# Patient Record
Sex: Male | Born: 2000 | Hispanic: No | Marital: Single | Smoking: Never smoker
Health system: Southern US, Community
[De-identification: ages and names within clinical notes are randomized; demographics above are authoritative.]

## PROBLEM LIST (undated history)

## (undated) DIAGNOSIS — I209 Angina pectoris, unspecified: Secondary | ICD-10-CM

## (undated) DIAGNOSIS — G43909 Migraine, unspecified, not intractable, without status migrainosus: Secondary | ICD-10-CM

## (undated) DIAGNOSIS — IMO0001 Reserved for inherently not codable concepts without codable children: Secondary | ICD-10-CM

## (undated) DIAGNOSIS — K219 Gastro-esophageal reflux disease without esophagitis: Secondary | ICD-10-CM

## (undated) DIAGNOSIS — Z464 Encounter for fitting and adjustment of orthodontic device: Secondary | ICD-10-CM

---
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
## 2021-01-09 ENCOUNTER — Other Ambulatory Visit: Payer: Self-pay | Admitting: Pain Medicine

## 2021-01-09 DIAGNOSIS — M545 Low back pain, unspecified: Secondary | ICD-10-CM

## 2021-01-09 DIAGNOSIS — R200 Anesthesia of skin: Secondary | ICD-10-CM

## 2021-01-09 DIAGNOSIS — M79606 Pain in leg, unspecified: Secondary | ICD-10-CM

## 2021-02-04 DIAGNOSIS — Z6830 Body mass index (BMI) 30.0-30.9, adult: Secondary | ICD-10-CM | POA: Insufficient documentation

## 2021-02-04 DIAGNOSIS — M5416 Radiculopathy, lumbar region: Secondary | ICD-10-CM | POA: Insufficient documentation

## 2021-03-20 DIAGNOSIS — R55 Syncope and collapse: Secondary | ICD-10-CM | POA: Insufficient documentation

## 2021-03-20 DIAGNOSIS — R079 Chest pain, unspecified: Secondary | ICD-10-CM | POA: Insufficient documentation

## 2021-03-20 DIAGNOSIS — U099 Post covid-19 condition, unspecified: Secondary | ICD-10-CM | POA: Insufficient documentation

## 2021-04-14 DIAGNOSIS — R000 Tachycardia, unspecified: Secondary | ICD-10-CM | POA: Insufficient documentation

## 2021-04-14 DIAGNOSIS — Z8679 Personal history of other diseases of the circulatory system: Secondary | ICD-10-CM | POA: Insufficient documentation

## 2021-04-14 HISTORY — DX: Personal history of other diseases of the circulatory system: Z86.79

## 2021-05-01 ENCOUNTER — Other Ambulatory Visit: Payer: Self-pay | Admitting: Neurosurgery

## 2021-05-06 ENCOUNTER — Other Ambulatory Visit: Payer: Self-pay | Admitting: Neurosurgery

## 2021-05-16 NOTE — Progress Notes (Signed)
Surgical Instructions    Your procedure is scheduled on Thursday, November 10th, 2022.   Report to [facility] Main Entrance "A" at 05:30 A.M., then check in with the Admitting office.  Call this number if you have problems the morning of surgery:  [phone]   If you have any questions prior to your surgery date call [phone]: Open Monday-Friday 8am-4pm    Remember:  Do not eat or drink after midnight the night before your surgery     Take these medicines the morning of surgery with A SIP OF WATER:  pantoprazole (PROTONIX) - if needed    As of today, STOP taking any Aspirin (unless otherwise instructed by your surgeon) Aleve, Naproxen, Ibuprofen, Motrin, Advil, Goody's, BC's, all herbal medications, fish oil, and all vitamins.    After your COVID test   You are not required to quarantine however you are required to wear a well-fitting mask when you are out and around people not in your household.  If your mask becomes wet or soiled, replace with a new one.  Wash your hands often with soap and water for 20 seconds or clean your hands with an alcohol-based hand sanitizer that contains at least 60% alcohol.  Do not share personal items.  Notify your provider: if you are in close contact with someone who has COVID  or if you develop a fever of 100.4 or greater, sneezing, cough, sore throat, shortness of breath or body aches.    The day of surgery:          Do not wear jewelry  Do not wear lotions, powders, colognes, or deodorant. Men may shave face and neck. Do not bring valuables to the hospital.              [facility] is not responsible for any belongings or valuables.  Do NOT Smoke (Tobacco/Vaping)  24 hours prior to your procedure  If you use a CPAP at night, you may bring your mask for your overnight stay.   Contacts, glasses, hearing aids, dentures or partials may not be worn into surgery, please bring cases for these belongings   For patients admitted  to the hospital, discharge time will be determined by your treatment team.   Patients discharged the day of surgery will not be allowed to drive home, and someone needs to stay with them for 24 hours.  NO VISITORS WILL BE ALLOWED IN PRE-OP WHERE PATIENTS ARE PREPPED FOR SURGERY.  ONLY 1 SUPPORT PERSON MAY BE PRESENT IN THE WAITING ROOM WHILE YOU ARE IN SURGERY.  IF YOU ARE TO BE ADMITTED, ONCE YOU ARE IN YOUR ROOM YOU WILL BE ALLOWED TWO (2) VISITORS. 1 (ONE) VISITOR MAY STAY OVERNIGHT BUT MUST ARRIVE TO THE ROOM BY 8pm.  Minor children may have two parents present. Special consideration for safety and communication needs will be reviewed on a case by case basis.  Special instructions:    Oral Hygiene is also important to reduce your risk of infection.  Remember - BRUSH YOUR TEETH THE MORNING OF SURGERY WITH YOUR REGULAR TOOTHPASTE   [facility]- Preparing For Surgery  Before surgery, you can play an important role. Because skin is not sterile, your skin needs to be as free of germs as possible. You can reduce the number of germs on your skin by washing with CHG (chlorahexidine gluconate) Soap before surgery.  CHG is an antiseptic cleaner which kills germs and bonds with the skin to continue killing germs even after  washing.     Please do not use if you have an allergy to CHG or antibacterial soaps. If your skin becomes reddened/irritated stop using the CHG.  Do not shave (including legs and underarms) for at least 48 hours prior to first CHG shower. It is OK to shave your face.  Please follow these instructions carefully.     Shower the NIGHT BEFORE SURGERY and the MORNING OF SURGERY with CHG Soap.   If you chose to wash your hair, wash your hair first as usual with your normal shampoo. After you shampoo, rinse your hair and body thoroughly to remove the shampoo.  Then [employer] and genitals (private parts) with your normal soap and rinse thoroughly to remove soap.  After that Use CHG Soap  as you would any other liquid soap. You can apply CHG directly to the skin and wash gently with a scrungie or a clean washcloth.   Apply the CHG Soap to your body ONLY FROM THE NECK DOWN.  Do not use on open wounds or open sores. Avoid contact with your eyes, ears, mouth and genitals (private parts). Wash Face and genitals (private parts)  with your normal soap.   Wash thoroughly, paying special attention to the area where your surgery will be performed.  Thoroughly rinse your body with warm water from the neck down.  DO NOT shower/wash with your normal soap after using and rinsing off the CHG Soap.  Pat yourself dry with a CLEAN TOWEL.  Wear CLEAN PAJAMAS to bed the night before surgery  Place CLEAN SHEETS on your bed the night before your surgery  DO NOT SLEEP WITH PETS.   Day of Surgery:  Take a shower with CHG soap. Wear Clean/Comfortable clothing the morning of surgery Do not apply any deodorants/lotions.   Remember to brush your teeth WITH YOUR REGULAR TOOTHPASTE.   Please read over the following fact sheets that you were given.

## 2021-05-19 ENCOUNTER — Encounter (HOSPITAL_COMMUNITY): Payer: Self-pay

## 2021-05-19 ENCOUNTER — Encounter (HOSPITAL_COMMUNITY)
Admission: RE | Admit: 2021-05-19 | Discharge: 2021-05-19 | Disposition: A | Discharge status: Home / Self Care | Source: Ambulatory Visit | Attending: Neurosurgery | Admitting: Neurosurgery

## 2021-05-19 ENCOUNTER — Other Ambulatory Visit: Payer: Self-pay

## 2021-05-19 VITALS — BP 131/77 | HR 69 | Temp 98.1°F | Resp 18 | Ht 73.0 in | Wt 240.6 lb

## 2021-05-19 DIAGNOSIS — Z20822 Contact with and (suspected) exposure to covid-19: Secondary | ICD-10-CM | POA: Diagnosis not present

## 2021-05-19 DIAGNOSIS — Z01812 Encounter for preprocedural laboratory examination: Secondary | ICD-10-CM | POA: Insufficient documentation

## 2021-05-19 DIAGNOSIS — Z01818 Encounter for other preprocedural examination: Secondary | ICD-10-CM

## 2021-05-19 HISTORY — DX: Gastro-esophageal reflux disease without esophagitis: K21.9

## 2021-05-19 HISTORY — DX: Angina pectoris, unspecified: I20.9

## 2021-05-19 LAB — BASIC METABOLIC PANEL
Anion gap: 7 (ref 5–15)
BUN: 12 mg/dL (ref 6–20)
CO2: 27 mmol/L (ref 22–32)
Calcium: 9.5 mg/dL (ref 8.9–10.3)
Chloride: 104 mmol/L (ref 98–111)
Creatinine, Ser: 1.08 mg/dL (ref 0.61–1.24)
GFR, Estimated: >60 mL/min (ref >60)
Glucose, Bld: 80 mg/dL (ref 70–99)
Potassium: 4.1 mmol/L (ref 3.5–5.1)
Sodium: 138 mmol/L (ref 135–145)

## 2021-05-19 LAB — CBC
HCT: 45.5 % (ref 39.0–52.0)
Hemoglobin: 14.9 g/dL (ref 13.0–17.0)
MCH: 29 pg (ref 26.0–34.0)
MCHC: 32.7 g/dL (ref 30.0–36.0)
MCV: 88.7 fL (ref 80.0–100.0)
Platelets: 235 10*3/uL (ref 150–400)
RBC: 5.13 MIL/uL (ref 4.22–5.81)
RDW: 12.7 % (ref 11.5–15.5)
WBC: 4.4 10*3/uL (ref 4.0–10.5)
nRBC: 0 % (ref 0.0–0.2)

## 2021-05-19 NOTE — Progress Notes (Addendum)
PCP - Emogene Morgan, Nile Dear, DNP, FNP Cardiologist - Ofilia Neas, MD  PPM/ICD - denies Device Orders - n/a Rep Notified - n/a  Chest x-ray - 02/18/2021 - CE EKG - 03/19/2021 - CE Stress Test - 04/11/2021 ECHO - 05/03/2020 - requested tracing Cardiac Cath - denies  Sleep Study - denies CPAP - n/a  Fasting Blood Sugar - n/a  Blood Thinner Instructions: n/a  Aspirin Instructions: Patient was instructed: As of today, STOP taking any Aspirin (unless otherwise instructed by your surgeon) Aleve, Naproxen, Ibuprofen, Motrin, Advil, Goody's, BC's, all herbal medications, fish oil, and all vitamins.    ERAS Protcol - n/a   COVID TEST- done in PAT on 05/19/2021   Anesthesia review: yes; history of chest pain; requested EKG tracing  Patient denies shortness of breath, fever, cough and chest pain at PAT appointment   All instructions explained to the patient, with a verbal understanding of the material. Patient agrees to go over the instructions while at home for a better understanding. Patient also instructed to self quarantine after being tested for COVID-19. The opportunity to ask questions was provided.

## 2021-05-20 LAB — SARS CORONAVIRUS 2 (TAT 6-24 HRS): SARS Coronavirus 2: NEGATIVE

## 2021-05-20 NOTE — Progress Notes (Signed)
Anesthesia Chart Review:  Patient had recent benign cardiac work-up at [facility] for what was ultimately felt to be symptoms of probable long COVID.  Last seen 04/14/2021 by Dr. Cecile Hearing.  Per note: Mr. Hippler is a 20 y.o. male who was previously healthy. I have been evaluating him for cardiovascular complications of probable long COVID which has fortunately been reassuring. He is seen today for follow-up  Tachycardia: Initially with some concern for supraventricular tachycardia on outside preliminary Holter reading. After personally reviewing the strips and learning that he was exercising without new symptoms at the time of these events I suspect this is all sinus tachycardia rather than SVT. This was further confirmed with exercise treadmill in our office last week. 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 I suspect his viral illness in 02/2020 was COVID and this may have been the trigger for long-covid type illness.  -- Recent labs reassuring and there is no signs of ongoing inflammation -- He will continue to follow with PCP -- Echo is reassuring ZIO only significant for sinus tachycardia --Advised ongoing exercise and targeting mid range cardiovascular intensity in addition to high intensity  Possible Prior COVID Myopericarditis: The timing and his initial presentation in 2021 were suggestive of COVID myopericarditis (or pericarditis alone) either from COVID infection in August or as a complication of the COVID vaccine. If it was present, the case was overall mild as the markers of inflammation had already normalized by the time he was evaluated in [place]. Suspect no residual damage. -- Echocardiogram reason inflammatory markers all normal -- DC MRI order as this is unlikely to change  management moving forward  Abnormal ECG: His ECG has a marked repolarization abnormality which is stable from his recent ER visit. Suspect normal variant and reassurance provided.  -- ETT reassuring -- He has had cardiac CTA as part of work-up earlier this year. He will send me a copy of these results  Return if symptoms worsen or fail to improve.  Preop labs reviewed, WNL.  EKG 03/19/2021 (Care Everywhere): Normal sinus rhythm.  Rate 81.  Nonspecific ST and T wave abnormality, consider ischemia, ventricular enlargement.  When compared with EKG of 02/18/2021, T wave inversions less pronounced anterior leads.  Exercise tolerance test 04/10/21: -  Adequate exercise capacity, with no exercise induced chest pain or  dyspnea at an estimated workload of 15.50 METS.  Prolongation of HR  recovery suggests mild cardiovascular deconditioning.  -  Normal ECG, rhythm and BP response to exercise at 92% of the  age-predicted maximal HR.  -  There are no rhythm changes to suggest a diagnosis of supraventricular  tachycardia  -  Normalization of ST segments with increase in HR supports baseline ECG  changes being due to early repolarization   TTE 04/10/21: Summary    1. The left ventricle is normal in size with normal wall thickness.    2. The left ventricular systolic function is normal, LVEF is visually  estimated at > 55%.    3. The right ventricle is normal in size, with normal systolic function.    4. There is no pulmonary hypertension.     [facility] Short Stay Center/Anesthesiology Phone [phone] 05/20/2021 3:24 PM

## 2021-05-20 NOTE — Anesthesia Preprocedure Evaluation (Addendum)
Anesthesia Evaluation  Patient identified by MRN, date of birth, ID band Patient awake    Reviewed: Allergy & Precautions, NPO status , Patient's Chart, lab work & pertinent test results  History of Anesthesia Complications Negative for: history of anesthetic complications  Airway Mallampati: II  TM Distance: >3 FB Neck ROM: Full    Dental  (+) Teeth Intact, Dental Advisory Given   Pulmonary neg pulmonary ROS,    Pulmonary exam normal        Cardiovascular negative cardio ROS Normal cardiovascular exam  Exercise tolerance test 04/10/21: - Adequate exercise capacity, with no exercise induced chest pain or  dyspnea at an estimated workload of 15.50 METS. Prolongation of HR  recovery suggests mild cardiovascular deconditioning.  - Normal ECG, rhythm and BP response to exercise at 92% of the  age-predicted maximal HR.  - There are no rhythm changes to suggest a diagnosis of supraventricular  tachycardia  - Normalization of ST segments with increase in HR supports baseline ECG  changes being due to early repolarization   TTE 04/10/21: Summary   1. The left ventricle is normal in size with normal wall thickness.   2. The left ventricular systolic function is normal, LVEF is visually  estimated at > 55%.   3. The right ventricle is normal in size, with normal systolic function.   4. There is no pulmonary hypertension.     Neuro/Psych negative neurological ROS     GI/Hepatic Neg liver ROS, GERD  Medicated and Controlled,  Endo/Other  negative endocrine ROS  Renal/GU negative Renal ROS     Musculoskeletal negative musculoskeletal ROS (+)   Abdominal   Peds  Hematology negative hematology ROS (+)   Anesthesia Other Findings   Reproductive/Obstetrics                          Anesthesia Physical Anesthesia Plan  ASA: 3  Anesthesia Plan: General   Post-op Pain Management:     Induction: Intravenous  PONV Risk Score and Plan: 3 and Ondansetron, Dexamethasone and Midazolam  Airway Management Planned: Oral ETT  Additional Equipment:   Intra-op Plan:   Post-operative Plan: Extubation in OR  Informed Consent: I have reviewed the patients History and Physical, chart, labs and discussed the procedure including the risks, benefits and alternatives for the proposed anesthesia with the patient or authorized representative who has indicated his/her understanding and acceptance.     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  Last seen 04/14/2021 by Dr. Cecile Hearing.  Per note: "Mr. Cappella is a 20 y.o. male who was previously healthy. I have been evaluating him for cardiovascular complications of probable long COVID which has fortunately been reassuring. He is seen today for follow-up  Tachycardia: Initially with some concern for supraventricular tachycardia on outside preliminary Holter reading. After personally reviewing the strips and learning that he was exercising without new symptoms at the time of these events I suspect this is all sinus tachycardia rather than SVT. This was further confirmed with exercise treadmill in our office last week. -- Recommended to stop metoprolol and flecainide -- He will let us know if any further or new symptoms  Suspected Long COVID: At this point given his constellation of symptoms and unrevealing workup thus far I am  most concerned for post-covid syndrome. I suspect his viral illness in 02/2020 was COVID and this may have been the trigger for long-covid type illness.  -- Recent labs reassuring and there is no signs of ongoing inflammation -- He will continue to follow with PCP -- Echo is reassuring ZIO only significant for sinus  tachycardia --Advised ongoing exercise and targeting mid range cardiovascular intensity in addition to high intensity  Possible Prior COVID Myopericarditis: The timing and his initial presentation in 2021 were suggestive of COVID myopericarditis (or pericarditis alone) either from Will infection in August or as a complication of the COVID vaccine. If it was present, the case was overall mild as the markers of inflammation had already normalized by the time he was evaluated in [place]. Suspect no residual damage. -- Echocardiogram reason inflammatory markers all normal -- DC MRI order as this is unlikely to change management moving forward  Abnormal ECG: His ECG has a marked repolarization abnormality which is stable from his recent ER visit. Suspect normal variant and reassurance provided.  -- ETT reassuring -- He has had cardiac CTA as part of work-up earlier this year. He will send me a copy of these results  Return if symptoms worsen or fail to improve."  Preop labs reviewed, WNL.  EKG 03/19/2021 (Care Everywhere): Normal sinus rhythm.  Rate 81.  Nonspecific ST and T wave abnormality, consider ischemia, ventricular enlargement.  When compared with EKG of 02/18/2021, T wave inversions less pronounced anterior leads.  Exercise tolerance test 04/10/21: - Adequate exercise capacity, with no exercise induced chest pain or  dyspnea at an estimated workload of 15.50 METS. Prolongation of HR  recovery suggests mild cardiovascular deconditioning.  - Normal ECG, rhythm and BP response to exercise at 92% of the  age-predicted maximal HR.  - There are no rhythm changes to suggest a diagnosis of supraventricular  tachycardia  - Normalization of ST segments with increase in HR supports baseline ECG  changes being due to early repolarization   TTE 04/10/21: Summary   1. The left ventricle is normal in size with normal wall thickness.   2. The left ventricular systolic function is normal, LVEF  is visually  estimated at > 55%.   3. The right ventricle is normal in size, with normal systolic function.   4. There is no pulmonary hypertension.    )      Anesthesia Quick Evaluation

## 2021-05-22 ENCOUNTER — Ambulatory Visit (HOSPITAL_COMMUNITY)
Admission: RE | Admit: 2021-05-22 | Discharge: 2021-05-22 | Disposition: A | Discharge status: Home / Self Care | Attending: Neurosurgery | Admitting: Neurosurgery

## 2021-05-22 ENCOUNTER — Ambulatory Visit (HOSPITAL_COMMUNITY)

## 2021-05-22 ENCOUNTER — Encounter (HOSPITAL_COMMUNITY): Payer: Self-pay

## 2021-05-22 ENCOUNTER — Ambulatory Visit (HOSPITAL_COMMUNITY): Admitting: Anesthesiology

## 2021-05-22 ENCOUNTER — Ambulatory Visit (HOSPITAL_COMMUNITY): Admitting: Physician Assistant

## 2021-05-22 ENCOUNTER — Encounter (HOSPITAL_COMMUNITY)
Admission: RE | Disposition: A | Discharge status: Home / Self Care | Payer: Self-pay | Source: Home / Self Care | Attending: Neurosurgery

## 2021-05-22 DIAGNOSIS — K219 Gastro-esophageal reflux disease without esophagitis: Secondary | ICD-10-CM | POA: Diagnosis not present

## 2021-05-22 DIAGNOSIS — M5117 Intervertebral disc disorders with radiculopathy, lumbosacral region: Secondary | ICD-10-CM | POA: Insufficient documentation

## 2021-05-22 DIAGNOSIS — Z419 Encounter for procedure for purposes other than remedying health state, unspecified: Secondary | ICD-10-CM

## 2021-05-22 HISTORY — PX: LUMBAR LAMINECTOMY/ DECOMPRESSION WITH MET-RX: SHX5959

## 2021-05-22 LAB — NASOPHARYNGEAL CULTURE: Culture: NORMAL

## 2021-05-22 SURGERY — LUMBAR LAMINECTOMY/ DECOMPRESSION WITH MET-RX
Anesthesia: General | Laterality: Right

## 2021-05-22 MED ORDER — FENTANYL CITRATE (PF) 100 MCG/2ML IJ SOLN
25.0000 ug | INTRAMUSCULAR | Status: DC | PRN
  Administered 2021-05-22 (×2): 50 ug via INTRAVENOUS

## 2021-05-22 MED ORDER — LIDOCAINE 2% (20 MG/ML) 5 ML SYRINGE
INTRAMUSCULAR | Status: AC
  Filled 2021-05-22: qty 5

## 2021-05-22 MED ORDER — THROMBIN 5000 UNITS EX SOLR
OROMUCOSAL | Status: DC | PRN
  Administered 2021-05-22: 5 mL via TOPICAL

## 2021-05-22 MED ORDER — LACTATED RINGERS IV SOLN: INTRAVENOUS | Status: DC

## 2021-05-22 MED ORDER — OXYCODONE-ACETAMINOPHEN 5-325 MG PO TABS: 1.0000 | ORAL_TABLET | ORAL | 0 refills | Status: AC | PRN

## 2021-05-22 MED ORDER — FENTANYL CITRATE (PF) 250 MCG/5ML IJ SOLN
INTRAMUSCULAR | Status: AC
  Filled 2021-05-22: qty 5

## 2021-05-22 MED ORDER — AMISULPRIDE (ANTIEMETIC) 5 MG/2ML IV SOLN: 10.0000 mg | Freq: Once | INTRAVENOUS | Status: DC | PRN

## 2021-05-22 MED ORDER — ONDANSETRON HCL 4 MG/2ML IJ SOLN
INTRAMUSCULAR | Status: DC | PRN
  Administered 2021-05-22: 4 mg via INTRAVENOUS

## 2021-05-22 MED ORDER — BUPIVACAINE HCL (PF) 0.5 % IJ SOLN
INTRAMUSCULAR | Status: AC
  Filled 2021-05-22: qty 30

## 2021-05-22 MED ORDER — CHLORHEXIDINE GLUCONATE CLOTH 2 % EX PADS: 6.0000 | MEDICATED_PAD | Freq: Once | CUTANEOUS | Status: DC

## 2021-05-22 MED ORDER — MIDAZOLAM HCL 2 MG/2ML IJ SOLN
INTRAMUSCULAR | Status: AC
  Filled 2021-05-22: qty 2

## 2021-05-22 MED ORDER — CELECOXIB 200 MG PO CAPS
200.0000 mg | ORAL_CAPSULE | Freq: Once | ORAL | Status: AC
  Administered 2021-05-22: 200 mg via ORAL
  Filled 2021-05-22: qty 1

## 2021-05-22 MED ORDER — LIDOCAINE-EPINEPHRINE 1 %-1:100000 IJ SOLN
INTRAMUSCULAR | Status: DC | PRN
  Administered 2021-05-22: 2 mL

## 2021-05-22 MED ORDER — METHYLPREDNISOLONE ACETATE 80 MG/ML IJ SUSP
INTRAMUSCULAR | Status: AC
  Filled 2021-05-22: qty 1

## 2021-05-22 MED ORDER — THROMBIN (RECOMBINANT) 5000 UNITS EX SOLR
CUTANEOUS | Status: AC
  Filled 2021-05-22: qty 5000

## 2021-05-22 MED ORDER — LACTATED RINGERS IV SOLN: INTRAVENOUS | Status: DC | PRN

## 2021-05-22 MED ORDER — CYCLOBENZAPRINE HCL 10 MG PO TABS: 10.0000 mg | ORAL_TABLET | Freq: Three times a day (TID) | ORAL | 2 refills | Status: AC | PRN

## 2021-05-22 MED ORDER — CEFAZOLIN SODIUM-DEXTROSE 2-4 GM/100ML-% IV SOLN
2.0000 g | INTRAVENOUS | Status: AC
  Administered 2021-05-22: 2 g via INTRAVENOUS
  Filled 2021-05-22: qty 100

## 2021-05-22 MED ORDER — ROCURONIUM BROMIDE 10 MG/ML (PF) SYRINGE
PREFILLED_SYRINGE | INTRAVENOUS | Status: DC | PRN
  Administered 2021-05-22: 80 mg via INTRAVENOUS
  Administered 2021-05-22 (×2): 20 mg via INTRAVENOUS

## 2021-05-22 MED ORDER — DOCUSATE SODIUM 100 MG PO CAPS: 100.0000 mg | ORAL_CAPSULE | Freq: Two times a day (BID) | ORAL | 2 refills | Status: AC

## 2021-05-22 MED ORDER — 0.9 % SODIUM CHLORIDE (POUR BTL) OPTIME
TOPICAL | Status: DC | PRN
  Administered 2021-05-22: 1000 mL

## 2021-05-22 MED ORDER — LIDOCAINE HCL 1 % IJ SOLN
INTRAMUSCULAR | Status: AC
  Filled 2021-05-22: qty 20

## 2021-05-22 MED ORDER — DEXAMETHASONE SODIUM PHOSPHATE 10 MG/ML IJ SOLN
INTRAMUSCULAR | Status: AC
  Filled 2021-05-22: qty 1

## 2021-05-22 MED ORDER — PROMETHAZINE HCL 25 MG/ML IJ SOLN: 6.2500 mg | INTRAMUSCULAR | Status: DC | PRN

## 2021-05-22 MED ORDER — LIDOCAINE-EPINEPHRINE 1 %-1:100000 IJ SOLN
INTRAMUSCULAR | Status: AC
  Filled 2021-05-22: qty 1

## 2021-05-22 MED ORDER — LIDOCAINE 2% (20 MG/ML) 5 ML SYRINGE
INTRAMUSCULAR | Status: DC | PRN
  Administered 2021-05-22: 100 mg via INTRAVENOUS

## 2021-05-22 MED ORDER — CHLORHEXIDINE GLUCONATE 0.12 % MT SOLN
15.0000 mL | Freq: Once | OROMUCOSAL | Status: AC
  Administered 2021-05-22: 15 mL via OROMUCOSAL
  Filled 2021-05-22: qty 15

## 2021-05-22 MED ORDER — THROMBIN 5000 UNITS EX SOLR
CUTANEOUS | Status: AC
  Filled 2021-05-22: qty 5000

## 2021-05-22 MED ORDER — FENTANYL CITRATE (PF) 100 MCG/2ML IJ SOLN
INTRAMUSCULAR | Status: AC
  Filled 2021-05-22: qty 2

## 2021-05-22 MED ORDER — PROPOFOL 10 MG/ML IV BOLUS
INTRAVENOUS | Status: AC
  Filled 2021-05-22: qty 40

## 2021-05-22 MED ORDER — BUPIVACAINE HCL 0.5 % IJ SOLN
INTRAMUSCULAR | Status: DC | PRN
  Administered 2021-05-22: 2 mL

## 2021-05-22 MED ORDER — ORAL CARE MOUTH RINSE: 15.0000 mL | Freq: Once | OROMUCOSAL | Status: AC

## 2021-05-22 MED ORDER — ROCURONIUM BROMIDE 10 MG/ML (PF) SYRINGE
PREFILLED_SYRINGE | INTRAVENOUS | Status: AC
  Filled 2021-05-22: qty 10

## 2021-05-22 MED ORDER — METHYLPREDNISOLONE ACETATE 80 MG/ML IJ SUSP
INTRAMUSCULAR | Status: DC | PRN
  Administered 2021-05-22: 80 mg

## 2021-05-22 MED ORDER — ACETAMINOPHEN 500 MG PO TABS
1000.0000 mg | ORAL_TABLET | Freq: Once | ORAL | Status: AC
  Administered 2021-05-22: 1000 mg via ORAL
  Filled 2021-05-22: qty 2

## 2021-05-22 MED ORDER — MIDAZOLAM HCL 5 MG/5ML IJ SOLN
INTRAMUSCULAR | Status: DC | PRN
  Administered 2021-05-22: 2 mg via INTRAVENOUS

## 2021-05-22 MED ORDER — DEXAMETHASONE SODIUM PHOSPHATE 10 MG/ML IJ SOLN
INTRAMUSCULAR | Status: DC | PRN
  Administered 2021-05-22: 10 mg via INTRAVENOUS

## 2021-05-22 MED ORDER — ONDANSETRON HCL 4 MG/2ML IJ SOLN
INTRAMUSCULAR | Status: AC
  Filled 2021-05-22: qty 2

## 2021-05-22 MED ORDER — FENTANYL CITRATE (PF) 250 MCG/5ML IJ SOLN
INTRAMUSCULAR | Status: DC | PRN
  Administered 2021-05-22 (×2): 100 ug via INTRAVENOUS

## 2021-05-22 MED ORDER — PROPOFOL 10 MG/ML IV BOLUS
INTRAVENOUS | Status: DC | PRN
  Administered 2021-05-22: 200 mg via INTRAVENOUS

## 2021-05-22 SURGICAL SUPPLY — 52 items
BAG COUNTER SPONGE SURGICOUNT (BAG) ×4 IMPLANT
BAND RUBBER #18 3X1/16 STRL (MISCELLANEOUS) ×4 IMPLANT
BENZOIN TINCTURE PRP APPL 2/3 (GAUZE/BANDAGES/DRESSINGS) IMPLANT
BLADE CLIPPER SURG (BLADE) ×2 IMPLANT
BUR CARBIDE MATCH 3.0 (BURR) ×2 IMPLANT
BUR PRECISION MATCH 3.0 13 (BURR) ×2 IMPLANT
CANISTER SUCT 3000ML PPV (MISCELLANEOUS) ×2 IMPLANT
DECANTER SPIKE VIAL GLASS SM (MISCELLANEOUS) ×2 IMPLANT
DERMABOND ADVANCED (GAUZE/BANDAGES/DRESSINGS) ×1
DERMABOND ADVANCED .7 DNX12 (GAUZE/BANDAGES/DRESSINGS) ×1 IMPLANT
DRAPE C-ARM 42X72 X-RAY (DRAPES) ×4 IMPLANT
DRAPE LAPAROTOMY 100X72X124 (DRAPES) ×2 IMPLANT
DRAPE MICROSCOPE LEICA (MISCELLANEOUS) ×2 IMPLANT
DRAPE SURG 17X23 STRL (DRAPES) ×2 IMPLANT
DRSG MEPILEX BORDER 4X4 (GAUZE/BANDAGES/DRESSINGS) IMPLANT
DRSG OPSITE POSTOP 3X4 (GAUZE/BANDAGES/DRESSINGS) ×2 IMPLANT
DURAPREP 26ML APPLICATOR (WOUND CARE) ×2 IMPLANT
ELECT BLADE INSULATED 4IN (ELECTROSURGICAL) ×2
ELECT REM PT RETURN 9FT ADLT (ELECTROSURGICAL) ×2
ELECTRODE BLADE INSULATED 4IN (ELECTROSURGICAL) ×1 IMPLANT
ELECTRODE REM PT RTRN 9FT ADLT (ELECTROSURGICAL) ×1 IMPLANT
GAUZE 4X4 16PLY ~~LOC~~+RFID DBL (SPONGE) ×2 IMPLANT
GAUZE SPONGE 4X4 12PLY STRL (GAUZE/BANDAGES/DRESSINGS) IMPLANT
GLOVE SURG LTX SZ7.5 (GLOVE) ×2 IMPLANT
GLOVE SURG UNDER POLY LF SZ7.5 (GLOVE) ×4 IMPLANT
GOWN STRL REUS W/ TWL LRG LVL3 (GOWN DISPOSABLE) ×2 IMPLANT
GOWN STRL REUS W/ TWL XL LVL3 (GOWN DISPOSABLE) IMPLANT
GOWN STRL REUS W/TWL 2XL LVL3 (GOWN DISPOSABLE) IMPLANT
GOWN STRL REUS W/TWL LRG LVL3 (GOWN DISPOSABLE) ×2
GOWN STRL REUS W/TWL XL LVL3 (GOWN DISPOSABLE)
HEMOSTAT POWDER KIT SURGIFOAM (HEMOSTASIS) ×2 IMPLANT
IV CATH AUTO 14GX1.75 SAFE ORG (IV SOLUTION) ×2 IMPLANT
KIT BASIN OR (CUSTOM PROCEDURE TRAY) ×2 IMPLANT
KIT TURNOVER KIT B (KITS) ×2 IMPLANT
NEEDLE HYPO 18GX1.5 BLUNT FILL (NEEDLE) ×2 IMPLANT
NEEDLE HYPO 25X1 1.5 SAFETY (NEEDLE) ×2 IMPLANT
NEEDLE SPNL 18GX3.5 QUINCKE PK (NEEDLE) ×2 IMPLANT
NS IRRIG 1000ML POUR BTL (IV SOLUTION) ×2 IMPLANT
PACK LAMINECTOMY NEURO (CUSTOM PROCEDURE TRAY) ×2 IMPLANT
PAD ARMBOARD 7.5X6 YLW CONV (MISCELLANEOUS) ×6 IMPLANT
SPONGE SURGIFOAM ABS GEL SZ50 (HEMOSTASIS) ×2 IMPLANT
SPONGE T-LAP 4X18 ~~LOC~~+RFID (SPONGE) ×2 IMPLANT
STRIP CLOSURE SKIN 1/2X4 (GAUZE/BANDAGES/DRESSINGS) IMPLANT
SUT MNCRL AB 4-0 PS2 18 (SUTURE) ×2 IMPLANT
SUT VIC AB 0 CT1 18XCR BRD8 (SUTURE) IMPLANT
SUT VIC AB 0 CT1 8-18 (SUTURE)
SUT VIC AB 2-0 CP2 18 (SUTURE) ×2 IMPLANT
SUT VIC AB 3-0 SH 8-18 (SUTURE) ×2 IMPLANT
SYR 3ML LL SCALE MARK (SYRINGE) ×2 IMPLANT
TOWEL GREEN STERILE (TOWEL DISPOSABLE) ×2 IMPLANT
TOWEL GREEN STERILE FF (TOWEL DISPOSABLE) ×2 IMPLANT
WATER STERILE IRR 1000ML POUR (IV SOLUTION) ×2 IMPLANT

## 2021-05-22 NOTE — Op Note (Signed)
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 Right L5-S1 laminotomy, medial facetectomy for excision of herniated disc using tubular retractor system 2.  Use of microscope for microdissection  SURGEON: Dr. Hoyt Koch, MD  ASSISTANT: Monia Pouch, DO. Please note, there were no qualified trainees available to assist with the procedure.  An assistant was required for aid in retraction of the neural elements.   ANESTHESIA: General Endotracheal  EBL: < 10 ml  SPECIMENS: None  DRAINS: None  COMPLICATIONS: none  CONDITION: Stable to PCAU  HISTORY: Kevin Espinoza is a 20 y.o. male who initially presented to the outpatient clinic with lumbar radiculopathy. MRI showed a large right L5-S1 disc herniation with nerve impingement.  The patient had failed nonsurgical management.  Therefore, microdiscectomy was offered to the patient.  Risks, benefits, alternatives, and expected convalescence were discussed.  Risks discussed included, but were not limited to, bleeding, pain, infection, scar, recurrent disc, instability, CSF leak, weakness, numbness, paralysis, and death.  T informed consent was obtained and the patient wished to proceed.  PROCEDURE IN DETAIL: The patient was brought to the operating room. After induction of general anesthesia, the patient was positioned on the operative table in the prone position on a Wilson frame with all pressure points meticulously padded. The skin of the low back was then prepped and draped in the usual sterile fashion.  Under fluoroscopy, the correct levels were identified and marked out on the skin, and after timeout was conducted, the skin was infiltrated with local anesthetic.  Right Paramedian skin incision was then made sharply over the affected level and the subcutaneous tissue and fascia were incised.  Initial dilator was then  passed to the interspace under fluoroscopic guidance.  The paraspinous muscle attachments were dissected from the L5 lamina using the dilators.  Successive dilators were used until a 20 mm tubular retractor was placed under fluoroscopic guidance and locked in place with an attachment arm.  Microscope was then introduced into the field.  Small amount of remaining musculature was dissected from the lamina and the interspace was visualized as well.  The medial facet was also exposed.  High-speed drill was used to perform a laminotomy as well as a modest medial facetectomy.  The ligamentum flavum was then removed with rongeurs.  The thecal sac and traversing nerve root were identified.  The epidural disc space was dissected with a 4 Penfield.  Upon retracting the nerve root medially, the large disc herniation was encountered.  The disc herniation was incised with a 15 blade and pituitary rongeur was used to remove fragments of disc.  Smaller additional fragments were also removed laterally in the foramen.  Following removal of the herniated disc, the nerve root appeared much more relaxed.  Woodson probe was used to ensure good decompression and there was no longer significant's impingement of the nerve roots.  Meticulous hemostasis was obtained.  The wound was irrigated thoroughly with bacitracin impregnated irrigation.  Depo-Medrol was then placed over the nerve root.  The tubular retractor was then withdrawn with hemostasis in the muscle obtained with bipolar.  The muscles were injected with half percent Marcaine.  The fascia was closed with 0 Vicryl stitches.  The dermal layer was closed with 2-0 Vicryl stitches in buried interrupted fashion.  The skin was closed with 4-0 Monocryl in subcuticular manner followed by Dermabond.  A sterile dressing was placed.  Patient was then  flipped supine and extubated by the anesthesia service.  All counts were correct at the end of surgery.  No complications were noted.

## 2021-05-22 NOTE — Anesthesia Procedure Notes (Signed)
Procedure Name: Intubation Date/Time: 05/22/2021 8:00 AM Performed by: Lovie Chol, CRNA Pre-anesthesia Checklist: Patient identified, Emergency Drugs available, Suction available and Patient being monitored Patient Re-evaluated:Patient Re-evaluated prior to induction Oxygen Delivery Method: Circle System Utilized Preoxygenation: Pre-oxygenation with 100% oxygen Induction Type: IV induction Ventilation: Mask ventilation without difficulty Laryngoscope Size: Miller and 3 Grade View: Grade I Tube type: Oral Tube size: 7.5 mm Number of attempts: 1 Airway Equipment and Method: Stylet and Oral airway Placement Confirmation: ETT inserted through vocal cords under direct vision, positive ETCO2 and breath sounds checked- equal and bilateral Secured at: 21 cm Tube secured with: Tape Dental Injury: Teeth and Oropharynx as per pre-operative assessment

## 2021-05-22 NOTE — Transfer of Care (Signed)
Immediate Anesthesia Transfer of Care Note  Patient: Kevin Espinoza  Procedure(s) Performed: MICODISCECTOMY, MINIMALLY INVASIVE, LUMBAR FIVE- SACRAL ONE (Right)  Patient Location: PACU  Anesthesia Type:General  Level of Consciousness: oriented, sedated and patient cooperative  Airway & Oxygen Therapy: Patient Spontanous Breathing and Patient connected to nasal cannula oxygen  Post-op Assessment: Report given to RN and Post -op Vital signs reviewed and stable  Post vital signs: Reviewed  Last Vitals:  Vitals Value Taken Time  BP 133/52 05/22/21 1014  Temp    Pulse 102 05/22/21 1019  Resp 17 05/22/21 1019  SpO2 96 % 05/22/21 1019  Vitals shown include unvalidated device data.  Last Pain:  Vitals:   05/22/21 0613  TempSrc: Oral  PainSc:       Patients Stated Pain Goal: 2 (05/22/21 0557)  Complications: No notable events documented.

## 2021-05-22 NOTE — H&P (Signed)
ZD:GLOVF leg numbness  HPI:     Patient is a 20 y.o. male presents with numbness and pain in his right leg.  He was found to have a large L5-S1 herniated disc on his right side.  Chiropractic care and medical therapies failed to imprve his symptoms.  There has been no change in his symptoms the past several months.    There are no problems to display for this patient.  Past Medical History:  Diagnosis Date   Anginal pain (HCC)    GERD (gastroesophageal reflux disease)    Migraine headache    none for several months   Orthodontics    permanent upper retainer    Past Surgical History:  Procedure Laterality Date   APPENDECTOMY  2013   SHOULDER ARTHROSCOPY WITH LABRAL REPAIR Right 08/12/2018   Procedure: Right shoulder arthroscopic anterior labral repair and capsulorraphy  Right shoulder extensive glenohumeral debridement;  Surgeon: Signa Kell, MD;  Location: [facility];  Service: Orthopedics;  Laterality: Right;  STAY 1ST CASE ARTHREX LABRAL TAPE 2.9MM PUSH LOCK AND 1.6 MM FIBERTAK STRAIGHT AND CURVED SUTURE PASSING  LASSOS TODD MUNDY ASSISTING    Medications Prior to Admission  Medication Sig Dispense Refill Last Dose   ibuprofen (ADVIL) 200 MG tablet Take 600 mg by mouth every 6 (six) hours as needed for moderate pain.   Past Week   ondansetron (ZOFRAN ODT) 4 MG disintegrating tablet Take 1 tablet (4 mg total) by mouth every 8 (eight) hours as needed for nausea or vomiting. (Patient not taking: Reported on 05/15/2021) 20 tablet 0 Not Taking   pantoprazole (PROTONIX) 40 MG tablet Take 40 mg by mouth daily as needed for heartburn.   More than a month   Allergies  Allergen Reactions   Zithromax [Azithromycin] Rash    Social History   Tobacco Use   Smoking status: Never   Smokeless tobacco: Never  Substance Use Topics   Alcohol use: No    History reviewed. No pertinent family history.   Review of Systems Pertinent items are noted in HPI.  Objective:    Patient Vitals for the past 8 hrs:  BP Temp Temp src Pulse Resp SpO2 Height Weight  05/22/21 0613 135/73 98 F (36.7 C) Oral 79 16 98 % 6\' 1"  (1.854 m) 109.1 kg   No intake/output data recorded. No intake/output data recorded.      General : Alert, cooperative, no distress, appears stated age   Head:  Normocephalic/atraumatic    Eyes: PERRL, conjunctiva/corneas clear, EOM's intact. Fundi could not be visualized Neck: Supple Chest:  Respirations unlabored Chest wall: no tenderness or deformity Heart: Regular rate and rhythm Abdomen: Soft, nontender and nondistended Extremities: warm and well-perfused Skin: normal turgor, color and texture Neurologic:  Alert, oriented x 3.  Eyes open spontaneously. PERRL, EOMI, VFC, no facial droop. V1-3 intact.  No dysarthria, tongue protrusion symmetric.  CNII-XII intact. Normal strength, sensation and reflexes throughout.  No pronator drift, full strength in legs. + R SLR       Data ReviewCBC:  Lab Results  Component Value Date   WBC 4.4 05/19/2021   RBC 5.13 05/19/2021   BMP:  Lab Results  Component Value Date   GLUCOSE 80 05/19/2021   CO2 27 05/19/2021   BUN 12 05/19/2021   CREATININE 1.08 05/19/2021   CALCIUM 9.5 05/19/2021   Radiology review:  MRI L-spine reviewed.  Assessment:   Active Problems:   * No active hospital problems. *  R L5-S1  HNP Plan:   - microdiscectomy today

## 2021-05-22 NOTE — Anesthesia Postprocedure Evaluation (Signed)
Anesthesia Post Note  Patient: REEF ACHTERBERG  Procedure(s) Performed: MICODISCECTOMY, MINIMALLY INVASIVE, LUMBAR FIVE- SACRAL ONE (Right)     Patient location during evaluation: PACU Anesthesia Type: General Level of consciousness: sedated Pain management: pain level controlled Vital Signs Assessment: post-procedure vital signs reviewed and stable Respiratory status: spontaneous breathing and respiratory function stable Cardiovascular status: stable Postop Assessment: no apparent nausea or vomiting Anesthetic complications: no   No notable events documented.  Last Vitals:  Vitals:   05/22/21 1050 05/22/21 1055  BP:  131/61  Pulse: 94 85  Resp: 16 12  Temp:    SpO2: 94% 93%    Last Pain:  Vitals:   05/22/21 1055  TempSrc:   PainSc: 3                  Ralphine Hinks DANIEL

## 2021-05-23 ENCOUNTER — Encounter (HOSPITAL_COMMUNITY): Payer: Self-pay | Admitting: Neurosurgery

## 2021-08-19 DIAGNOSIS — I514 Myocarditis, unspecified: Secondary | ICD-10-CM | POA: Insufficient documentation

## 2021-08-19 DIAGNOSIS — R1310 Dysphagia, unspecified: Secondary | ICD-10-CM | POA: Insufficient documentation

## 2021-08-19 DIAGNOSIS — R5383 Other fatigue: Secondary | ICD-10-CM

## 2021-08-19 DIAGNOSIS — R002 Palpitations: Secondary | ICD-10-CM | POA: Insufficient documentation

## 2021-08-19 DIAGNOSIS — Z8669 Personal history of other diseases of the nervous system and sense organs: Secondary | ICD-10-CM | POA: Insufficient documentation

## 2021-08-19 DIAGNOSIS — R41840 Attention and concentration deficit: Secondary | ICD-10-CM | POA: Insufficient documentation

## 2021-08-19 DIAGNOSIS — R4189 Other symptoms and signs involving cognitive functions and awareness: Secondary | ICD-10-CM

## 2021-08-19 DIAGNOSIS — R439 Unspecified disturbances of smell and taste: Secondary | ICD-10-CM | POA: Insufficient documentation

## 2021-08-19 HISTORY — DX: Other symptoms and signs involving cognitive functions and awareness: R41.89

## 2021-08-19 HISTORY — DX: Personal history of other diseases of the nervous system and sense organs: Z86.69

## 2021-08-19 HISTORY — DX: Other fatigue: R53.83

## 2022-12-17 ENCOUNTER — Ambulatory Visit: Admitting: Family

## 2022-12-17 ENCOUNTER — Encounter: Payer: Self-pay | Admitting: Family

## 2022-12-17 VITALS — BP 122/60 | HR 77 | Ht 73.0 in | Wt 231.0 lb

## 2022-12-17 DIAGNOSIS — R5383 Other fatigue: Secondary | ICD-10-CM

## 2022-12-17 DIAGNOSIS — K219 Gastro-esophageal reflux disease without esophagitis: Secondary | ICD-10-CM | POA: Insufficient documentation

## 2022-12-17 DIAGNOSIS — E782 Mixed hyperlipidemia: Secondary | ICD-10-CM | POA: Diagnosis not present

## 2022-12-17 DIAGNOSIS — R7303 Prediabetes: Secondary | ICD-10-CM

## 2022-12-17 DIAGNOSIS — E538 Deficiency of other specified B group vitamins: Secondary | ICD-10-CM

## 2022-12-17 DIAGNOSIS — E559 Vitamin D deficiency, unspecified: Secondary | ICD-10-CM

## 2022-12-17 MED ORDER — IBUPROFEN 800 MG PO TABS: 800.0000 mg | ORAL_TABLET | Freq: Three times a day (TID) | ORAL | 0 refills | Status: AC | PRN

## 2022-12-17 NOTE — Progress Notes (Signed)
Established Patient Office Visit  Subjective:  Patient ID: Kevin Espinoza, male    DOB: [birth year]  Age: 22 y.o. MRN: [id number]  Chief Complaint  Patient presents with   Jaw Pain    Jaw pain    Patient is here with pan in the left side of his jaw.  He has been feeling fairly well overall, but this problem recently started.   He denies any chest pains, but the pain does radiate down his neck and to his ear.   No other concerns at this time.   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   Impaired cognition 08/19/2021   Migraine headache    none for several months   Orthodontics    permanent upper retainer    Past Surgical History:  Procedure Laterality Date   APPENDECTOMY  2013   LUMBAR LAMINECTOMY/ DECOMPRESSION WITH MET-RX Right 05/22/2021   Procedure: MICODISCECTOMY, MINIMALLY INVASIVE, LUMBAR FIVE- SACRAL ONE;  Surgeon: Bedelia Person, MD;  Location: [facility];  Service: Neurosurgery;  Laterality: Right;  MICODISCECTOMY, MINIMALLY INVASIVE, LUMBAR FIVE- SACRAL ONE   SHOULDER ARTHROSCOPY WITH LABRAL REPAIR Right 08/12/2018   Procedure: Right shoulder arthroscopic anterior labral repair and capsulorraphy  Right shoulder extensive glenohumeral debridement;  Surgeon: Signa Kell, MD;  Location: [facility];  Service: Orthopedics;  Laterality: Right;  STAY 1ST CASE ARTHREX LABRAL TAPE 2.9MM PUSH LOCK AND 1.6 MM FIBERTAK STRAIGHT AND CURVED SUTURE PASSING  LASSOS TODD MUNDY ASSISTING    Social History   Socioeconomic History   Marital status: Single    Spouse name: Not on file   Number of children: Not on file   Years of education: Not on file   Highest education level: Not on file  Occupational History   Not on file  Tobacco Use   Smoking status:  Never   Smokeless tobacco: Never  Vaping Use   Vaping Use: Never used  Substance and Sexual Activity   Alcohol use: No   Drug use: Not on file   Sexual activity: Not on file  Other Topics Concern   Not on file  Social History Narrative   Not on file   Social Determinants of Health   Financial Resource Strain: Not on file  Food Insecurity: Not on file  Transportation Needs: Not on file  Physical Activity: Not on file  Stress: Not on file  Social Connections: Not on file  Intimate Partner Violence: Not on file    No family history on file.  Allergies  Allergen Reactions   Zithromax [Azithromycin] Rash    ROS     Objective:   BP 122/60   Pulse 77   Ht 6\' 1"  (1.854 m)   Wt 231 lb (104.8 kg)   SpO2 98%   BMI 30.48 kg/m   Vitals:   12/17/22 1132  BP: 122/60  Pulse: 77  Height: 6\' 1"  (1.854 m)  Weight: 231 lb (104.8 kg)  SpO2: 98%  BMI (Calculated): 30.48    Physical Exam   Results for orders placed or performed in visit on 12/17/22  Lipid panel  Result Value Ref Range   Cholesterol, Total 188 100 - 199 mg/dL   Triglycerides 76 0 - 149 mg/dL   HDL 58 >57 mg/dL  VLDL Cholesterol Cal 14 5 - 40 mg/dL   LDL Chol Calc (NIH) 161 (H) 0 - 99 mg/dL   Chol/HDL Ratio 3.2 0.0 - 5.0 ratio  VITAMIN D 25 Hydroxy (Vit-D Deficiency, Fractures)  Result Value Ref Range   Vit D, 25-Hydroxy 25.0 (L) 30.0 - 100.0 ng/mL  CBC With Differential  Result Value Ref Range   WBC 3.3 (L) 3.4 - 10.8 x10E3/uL   RBC 5.25 4.14 - 5.80 x10E6/uL   Hemoglobin 15.3 13.0 - 17.7 g/dL   Hematocrit 09.6 04.5 - 51.0 %   MCV 87 79 - 97 fL   MCH 29.1 26.6 - 33.0 pg   MCHC 33.6 31.5 - 35.7 g/dL   RDW 40.9 81.1 - 91.4 %   Neutrophils 47 Not Estab. %   Lymphs 39 Not Estab. %   Monocytes 11 Not Estab. %   Eos 2 Not Estab. %   Basos 1 Not Estab. %   Neutrophils Absolute 1.5 1.4 - 7.0 x10E3/uL   Lymphocytes Absolute 1.3 0.7 - 3.1 x10E3/uL   Monocytes Absolute 0.4 0.1 - 0.9 x10E3/uL   EOS  (ABSOLUTE) 0.1 0.0 - 0.4 x10E3/uL   Basophils Absolute 0.0 0.0 - 0.2 x10E3/uL   Immature Granulocytes 0 Not Estab. %   Immature Grans (Abs) 0.0 0.0 - 0.1 x10E3/uL  CMP14+EGFR  Result Value Ref Range   Glucose 88 70 - 99 mg/dL   BUN 16 6 - 20 mg/dL   Creatinine, Ser 7.82 0.76 - 1.27 mg/dL   eGFR 87 >95 AO/ZHY/8.65   BUN/Creatinine Ratio 13 9 - 20   Sodium 140 134 - 144 mmol/L   Potassium 4.8 3.5 - 5.2 mmol/L   Chloride 102 96 - 106 mmol/L   CO2 23 20 - 29 mmol/L   Calcium 9.8 8.7 - 10.2 mg/dL   Total Protein 7.3 6.0 - 8.5 g/dL   Albumin 4.6 4.3 - 5.2 g/dL   Globulin, Total 2.7 1.5 - 4.5 g/dL   Albumin/Globulin Ratio 1.7 1.2 - 2.2   Bilirubin Total 0.6 0.0 - 1.2 mg/dL   Alkaline Phosphatase 61 44 - 121 IU/L   AST 32 0 - 40 IU/L   ALT 35 0 - 44 IU/L  TSH  Result Value Ref Range   TSH 1.020 0.450 - 4.500 uIU/mL  Hemoglobin A1c  Result Value Ref Range   Hgb A1c MFr Bld 5.5 4.8 - 5.6 %   Est. average glucose Bld gHb Est-mCnc 111 mg/dL  Vitamin H84  Result Value Ref Range   Vitamin B-12 606 232 - 1,245 pg/mL    Recent Results (from the past 2160 hour(s))  Lipid panel     Status: Abnormal   Collection Time: 12/17/22 12:00 PM  Result Value Ref Range   Cholesterol, Total 188 100 - 199 mg/dL   Triglycerides 76 0 - 149 mg/dL   HDL 58 >69 mg/dL   VLDL Cholesterol Cal 14 5 - 40 mg/dL   LDL Chol Calc (NIH) 629 (H) 0 - 99 mg/dL   Chol/HDL Ratio 3.2 0.0 - 5.0 ratio    Comment:                                   T. Chol/HDL Ratio  Men  Women                               1/2 Avg.Risk  3.4    3.3                                   Avg.Risk  5.0    4.4                                2X Avg.Risk  9.6    7.1                                3X Avg.Risk 23.4   11.0   VITAMIN D 25 Hydroxy (Vit-D Deficiency, Fractures)     Status: Abnormal   Collection Time: 12/17/22 12:00 PM  Result Value Ref Range   Vit D, 25-Hydroxy 25.0 (L) 30.0 - 100.0 ng/mL     Comment: Vitamin D deficiency has been defined by the Institute of Medicine and an Endocrine Society practice guideline as a level of serum 25-OH vitamin D less than 20 ng/mL (1,2). The Endocrine Society went on to further define vitamin D insufficiency as a level between 21 and 29 ng/mL (2). 1. IOM (Institute of Medicine). 2010. Dietary reference    intakes for calcium and D. [place]: The    [employer]. 2. Holick MF, Binkley Calwa, Bischoff-Ferrari HA, et al.    Evaluation, treatment, and prevention of vitamin D    deficiency: an Endocrine Society clinical practice    guideline. JCEM. 2011 Jul; 96(7):1911-30.   CBC With Differential     Status: Abnormal   Collection Time: 12/17/22 12:00 PM  Result Value Ref Range   WBC 3.3 (L) 3.4 - 10.8 x10E3/uL   RBC 5.25 4.14 - 5.80 x10E6/uL   Hemoglobin 15.3 13.0 - 17.7 g/dL   Hematocrit 16.1 09.6 - 51.0 %   MCV 87 79 - 97 fL   MCH 29.1 26.6 - 33.0 pg   MCHC 33.6 31.5 - 35.7 g/dL   RDW 04.5 40.9 - 81.1 %   Neutrophils 47 Not Estab. %   Lymphs 39 Not Estab. %   Monocytes 11 Not Estab. %   Eos 2 Not Estab. %   Basos 1 Not Estab. %   Neutrophils Absolute 1.5 1.4 - 7.0 x10E3/uL   Lymphocytes Absolute 1.3 0.7 - 3.1 x10E3/uL   Monocytes Absolute 0.4 0.1 - 0.9 x10E3/uL   EOS (ABSOLUTE) 0.1 0.0 - 0.4 x10E3/uL   Basophils Absolute 0.0 0.0 - 0.2 x10E3/uL   Immature Granulocytes 0 Not Estab. %   Immature Grans (Abs) 0.0 0.0 - 0.1 x10E3/uL    Comment: **Effective February 08, 2023, profile [id number] CBC/Differential**   (No Platelet) will be made non-orderable. [facility] Offers:   [id number] CBC With Differential/Platelet   CMP14+EGFR     Status: None   Collection Time: 12/17/22 12:00 PM  Result Value Ref Range   Glucose 88 70 - 99 mg/dL   BUN 16 6 - 20 mg/dL   Creatinine, Ser 9.56 0.76 - 1.27 mg/dL   eGFR 87 >21 HY/QMV/7.84   BUN/Creatinine Ratio 13 9 - 20   Sodium 140 134 - 144 mmol/L   Potassium 4.8 3.5 - 5.2  mmol/L   Chloride  102 96 - 106 mmol/L   CO2 23 20 - 29 mmol/L   Calcium 9.8 8.7 - 10.2 mg/dL   Total Protein 7.3 6.0 - 8.5 g/dL   Albumin 4.6 4.3 - 5.2 g/dL   Globulin, Total 2.7 1.5 - 4.5 g/dL   Albumin/Globulin Ratio 1.7 1.2 - 2.2   Bilirubin Total 0.6 0.0 - 1.2 mg/dL   Alkaline Phosphatase 61 44 - 121 IU/L   AST 32 0 - 40 IU/L   ALT 35 0 - 44 IU/L  TSH     Status: None   Collection Time: 12/17/22 12:00 PM  Result Value Ref Range   TSH 1.020 0.450 - 4.500 uIU/mL  Hemoglobin A1c     Status: None   Collection Time: 12/17/22 12:00 PM  Result Value Ref Range   Hgb A1c MFr Bld 5.5 4.8 - 5.6 %    Comment:          Prediabetes: 5.7 - 6.4          Diabetes: >6.4          Glycemic control for adults with diabetes: <7.0    Est. average glucose Bld gHb Est-mCnc 111 mg/dL  Vitamin Z61     Status: None   Collection Time: 12/17/22 12:00 PM  Result Value Ref Range   Vitamin B-12 606 232 - 1,245 pg/mL       Assessment & Plan:   Problem List Items Addressed This Visit   None Visit Diagnoses     B12 deficiency due to diet    -  Primary   Patient stable.  Well controlled with current therapy.   Continue current meds.   Relevant Orders   CBC With Differential (Completed)   CMP14+EGFR (Completed)   Vitamin B12 (Completed)   Mixed hyperlipidemia       Patient stable.  Well controlled with current therapy.   Continue current meds.   Relevant Orders   Lipid panel (Completed)   CBC With Differential (Completed)   CMP14+EGFR (Completed)   Prediabetes       Patient stable.  Well controlled with current therapy.   Continue current meds.   Relevant Orders   CBC With Differential (Completed)   CMP14+EGFR (Completed)   Hemoglobin A1c (Completed)   Vitamin D deficiency, unspecified       Patient stable.  Well controlled with current therapy.   Continue current meds.   Relevant Orders   VITAMIN D 25 Hydroxy (Vit-D Deficiency, Fractures) (Completed)   CBC With Differential (Completed)    CMP14+EGFR (Completed)   Other fatigue       Patient stable.  Well controlled with current therapy.   Continue current meds.   Relevant Orders   CBC With Differential (Completed)   CMP14+EGFR (Completed)   TSH (Completed)       No follow-ups on file.   Total time spent: 20 minutes  Miki Kins, FNP  12/17/2022   This document may have been prepared by [facility] Voice Recognition software and as such may include unintentional dictation errors.

## 2022-12-18 LAB — CBC WITH DIFFERENTIAL
Basophils Absolute: 0 10*3/uL (ref 0.0–0.2)
Basos: 1 %
EOS (ABSOLUTE): 0.1 10*3/uL (ref 0.0–0.4)
Eos: 2 %
Hematocrit: 45.5 % (ref 37.5–51.0)
Hemoglobin: 15.3 g/dL (ref 13.0–17.7)
Immature Grans (Abs): 0 10*3/uL (ref 0.0–0.1)
Immature Granulocytes: 0 %
Lymphocytes Absolute: 1.3 10*3/uL (ref 0.7–3.1)
Lymphs: 39 %
MCH: 29.1 pg (ref 26.6–33.0)
MCHC: 33.6 g/dL (ref 31.5–35.7)
MCV: 87 fL (ref 79–97)
Monocytes Absolute: 0.4 10*3/uL (ref 0.1–0.9)
Monocytes: 11 %
Neutrophils Absolute: 1.5 10*3/uL (ref 1.4–7.0)
Neutrophils: 47 %
RBC: 5.25 x10E6/uL (ref 4.14–5.80)
RDW: 13.1 % (ref 11.6–15.4)
WBC: 3.3 10*3/uL — ABNORMAL LOW (ref 3.4–10.8)

## 2022-12-18 LAB — CMP14+EGFR
ALT: 35 IU/L (ref 0–44)
AST: 32 IU/L (ref 0–40)
Albumin/Globulin Ratio: 1.7 (ref 1.2–2.2)
Albumin: 4.6 g/dL (ref 4.3–5.2)
Alkaline Phosphatase: 61 IU/L (ref 44–121)
BUN/Creatinine Ratio: 13 (ref 9–20)
BUN: 16 mg/dL (ref 6–20)
Bilirubin Total: 0.6 mg/dL (ref 0.0–1.2)
CO2: 23 mmol/L (ref 20–29)
Calcium: 9.8 mg/dL (ref 8.7–10.2)
Chloride: 102 mmol/L (ref 96–106)
Creatinine, Ser: 1.21 mg/dL (ref 0.76–1.27)
Globulin, Total: 2.7 g/dL (ref 1.5–4.5)
Glucose: 88 mg/dL (ref 70–99)
Potassium: 4.8 mmol/L (ref 3.5–5.2)
Sodium: 140 mmol/L (ref 134–144)
Total Protein: 7.3 g/dL (ref 6.0–8.5)
eGFR: 87 mL/min/{1.73_m2} (ref >59)

## 2022-12-18 LAB — LIPID PANEL
Chol/HDL Ratio: 3.2 ratio (ref 0.0–5.0)
Cholesterol, Total: 188 mg/dL (ref 100–199)
HDL: 58 mg/dL (ref >39)
LDL Chol Calc (NIH): 116 mg/dL — ABNORMAL HIGH (ref 0–99)
Triglycerides: 76 mg/dL (ref 0–149)
VLDL Cholesterol Cal: 14 mg/dL (ref 5–40)

## 2022-12-18 LAB — VITAMIN B12: Vitamin B-12: 606 pg/mL (ref 232–1245)

## 2022-12-18 LAB — TSH: TSH: 1.02 u[IU]/mL (ref 0.450–4.500)

## 2022-12-18 LAB — HEMOGLOBIN A1C
Est. average glucose Bld gHb Est-mCnc: 111 mg/dL
Hgb A1c MFr Bld: 5.5 % (ref 4.8–5.6)

## 2022-12-18 LAB — VITAMIN D 25 HYDROXY (VIT D DEFICIENCY, FRACTURES): Vit D, 25-Hydroxy: 25 ng/mL — ABNORMAL LOW (ref 30.0–100.0)

## 2022-12-30 ENCOUNTER — Encounter: Payer: Self-pay | Admitting: Family

## 2023-09-11 DIAGNOSIS — Z419 Encounter for procedure for purposes other than remedying health state, unspecified: Secondary | ICD-10-CM | POA: Diagnosis not present

## 2023-09-17 DIAGNOSIS — M5416 Radiculopathy, lumbar region: Secondary | ICD-10-CM | POA: Diagnosis not present

## 2023-10-23 DIAGNOSIS — Z419 Encounter for procedure for purposes other than remedying health state, unspecified: Secondary | ICD-10-CM | POA: Diagnosis not present

## 2023-11-08 DIAGNOSIS — M5116 Intervertebral disc disorders with radiculopathy, lumbar region: Secondary | ICD-10-CM | POA: Diagnosis not present

## 2023-11-10 DIAGNOSIS — M5116 Intervertebral disc disorders with radiculopathy, lumbar region: Secondary | ICD-10-CM | POA: Diagnosis not present

## 2023-11-15 DIAGNOSIS — M5116 Intervertebral disc disorders with radiculopathy, lumbar region: Secondary | ICD-10-CM | POA: Diagnosis not present

## 2023-11-17 DIAGNOSIS — M5416 Radiculopathy, lumbar region: Secondary | ICD-10-CM | POA: Diagnosis not present

## 2023-11-17 DIAGNOSIS — M5116 Intervertebral disc disorders with radiculopathy, lumbar region: Secondary | ICD-10-CM | POA: Diagnosis not present

## 2023-11-22 DIAGNOSIS — M5116 Intervertebral disc disorders with radiculopathy, lumbar region: Secondary | ICD-10-CM | POA: Diagnosis not present

## 2023-11-22 DIAGNOSIS — Z419 Encounter for procedure for purposes other than remedying health state, unspecified: Secondary | ICD-10-CM | POA: Diagnosis not present

## 2023-11-24 DIAGNOSIS — M5116 Intervertebral disc disorders with radiculopathy, lumbar region: Secondary | ICD-10-CM | POA: Diagnosis not present

## 2023-11-29 DIAGNOSIS — M5116 Intervertebral disc disorders with radiculopathy, lumbar region: Secondary | ICD-10-CM | POA: Diagnosis not present

## 2023-12-01 DIAGNOSIS — M5116 Intervertebral disc disorders with radiculopathy, lumbar region: Secondary | ICD-10-CM | POA: Diagnosis not present

## 2023-12-08 DIAGNOSIS — M5116 Intervertebral disc disorders with radiculopathy, lumbar region: Secondary | ICD-10-CM | POA: Diagnosis not present

## 2023-12-10 DIAGNOSIS — M5116 Intervertebral disc disorders with radiculopathy, lumbar region: Secondary | ICD-10-CM | POA: Diagnosis not present

## 2023-12-15 DIAGNOSIS — M5116 Intervertebral disc disorders with radiculopathy, lumbar region: Secondary | ICD-10-CM | POA: Diagnosis not present

## 2023-12-22 DIAGNOSIS — M5116 Intervertebral disc disorders with radiculopathy, lumbar region: Secondary | ICD-10-CM | POA: Diagnosis not present

## 2023-12-23 DIAGNOSIS — Z419 Encounter for procedure for purposes other than remedying health state, unspecified: Secondary | ICD-10-CM | POA: Diagnosis not present

## 2024-01-12 DIAGNOSIS — M5416 Radiculopathy, lumbar region: Secondary | ICD-10-CM | POA: Diagnosis not present

## 2024-01-22 DIAGNOSIS — Z419 Encounter for procedure for purposes other than remedying health state, unspecified: Secondary | ICD-10-CM | POA: Diagnosis not present

## 2024-02-22 DIAGNOSIS — Z419 Encounter for procedure for purposes other than remedying health state, unspecified: Secondary | ICD-10-CM | POA: Diagnosis not present

## 2024-03-15 DIAGNOSIS — M5416 Radiculopathy, lumbar region: Secondary | ICD-10-CM | POA: Diagnosis not present

## 2024-03-15 DIAGNOSIS — M7918 Myalgia, other site: Secondary | ICD-10-CM | POA: Diagnosis not present

## 2024-03-15 DIAGNOSIS — M5412 Radiculopathy, cervical region: Secondary | ICD-10-CM | POA: Diagnosis not present

## 2024-03-24 DIAGNOSIS — Z419 Encounter for procedure for purposes other than remedying health state, unspecified: Secondary | ICD-10-CM | POA: Diagnosis not present

## 2024-03-31 DIAGNOSIS — M5416 Radiculopathy, lumbar region: Secondary | ICD-10-CM | POA: Diagnosis not present

## 2024-05-24 DIAGNOSIS — Z419 Encounter for procedure for purposes other than remedying health state, unspecified: Secondary | ICD-10-CM | POA: Diagnosis not present

## 2024-06-02 DIAGNOSIS — M5412 Radiculopathy, cervical region: Secondary | ICD-10-CM | POA: Diagnosis not present

## 2024-06-02 DIAGNOSIS — M5416 Radiculopathy, lumbar region: Secondary | ICD-10-CM | POA: Diagnosis not present

## 2024-07-04 DIAGNOSIS — M5117 Intervertebral disc disorders with radiculopathy, lumbosacral region: Secondary | ICD-10-CM | POA: Diagnosis not present

## 2024-07-04 DIAGNOSIS — M5412 Radiculopathy, cervical region: Secondary | ICD-10-CM | POA: Diagnosis not present

## 2024-07-04 DIAGNOSIS — M4807 Spinal stenosis, lumbosacral region: Secondary | ICD-10-CM | POA: Diagnosis not present

## 2024-07-04 DIAGNOSIS — M4802 Spinal stenosis, cervical region: Secondary | ICD-10-CM | POA: Diagnosis not present

## 2024-07-04 DIAGNOSIS — M48061 Spinal stenosis, lumbar region without neurogenic claudication: Secondary | ICD-10-CM | POA: Diagnosis not present

## 2024-08-09 ENCOUNTER — Ambulatory Visit: Payer: Self-pay

## 2024-08-09 NOTE — Telephone Encounter (Signed)
" °  FYI Only or Action Required?: FYI only for provider: new pt appt scheduled, pt encouraged to see UC in the mean time for his symptoms.  Patient was last seen in primary care on 12/17/2022 by Orlean Alan HERO, FNP.  Called Nurse Triage reporting Chest Pain.  Symptoms began several years ago.  Interventions attempted: Nothing.  Symptoms are: stable.  Triage Disposition: See PCP When Office is Open (Within 3 Days)  Patient/caregiver understands and will follow disposition?: Yes    Reason for Triage: Breathing issue, Left top rib pain / dull ache , lots of pressure as well.  Pt also states it feels like some one is constantly sitting on his chest    Reason for Disposition  [1] Chest pain from known angina comes and goes AND [2] is NOT happening more often (increasing in frequency) or getting worse (increasing in severity)  Answer Assessment - Initial Assessment Questions 1. LOCATION: Where does it hurt?       Left side of my top rib, close to my diaphragm, like a dull ache  2. RADIATION: Does the pain go anywhere else? (e.g., into neck, jaw, arms, back)     Radiates to collarbones  3. ONSET: When did the chest pain begin? (Minutes, hours or days)      4 years ago when I was playing football, worse the last 1.5 year   4. PATTERN: Does the pain come and go, or has it been constant since it started?  Does it get worse with exertion?      Constant  6. SEVERITY: How bad is the pain?  (e.g., Scale 1-10; mild, moderate, or severe)     3/10 annoying  7. CARDIAC RISK FACTORS: Do you have any history of heart problems or risk factors for heart disease? (e.g., angina, prior heart attack; diabetes, high blood pressure, high cholesterol, smoker, or strong family history of heart disease)     History of myocarditis per chart. Has been seen for this CP by [facility], received a stress test, EKG per patient and been cleared per pt   9. CAUSE: What do you think is  causing the chest pain?     I've seen doctors for it before ... I went and saw a cardiologist, he cleared me. ... I've had a stress test and everything.   10. OTHER SYMPTOMS: Do you have any other symptoms? (e.g., dizziness, nausea, vomiting, sweating, fever, difficulty breathing, cough)       Little short of breath when I stand up, little lightheaded.  Protocols used: Chest Pain-A-AH  "

## 2024-08-18 ENCOUNTER — Ambulatory Visit
Admission: EM | Admit: 2024-08-18 | Discharge: 2024-08-18 | Disposition: A | Discharge status: Home / Self Care | Source: Home / Self Care

## 2024-08-18 DIAGNOSIS — B349 Viral infection, unspecified: Secondary | ICD-10-CM

## 2024-08-18 LAB — POC COVID19/FLU A&B COMBO
Covid Antigen, POC: NEGATIVE
Influenza A Antigen, POC: NEGATIVE
Influenza B Antigen, POC: NEGATIVE

## 2024-08-18 NOTE — ED Provider Notes (Signed)
 " CAY RALPH PELT    CSN: [id number] Arrival date & time: 08/18/24  1155      History   Chief Complaint Chief Complaint  Patient presents with   Generalized Body Aches    HPI AHAN EISENBERGER is a 24 y.o. male.  Patient presents with 1 day history of generalized bodyaches, headache, nausea.  No fever, congestion, cough, shortness of breath, chest pain, vomiting, diarrhea.  He has been treating his symptoms with DayQuil and NyQuil.  The history is provided by the patient and medical records.    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   Palpitations 08/19/2021   Poor concentration 08/19/2021   Sinus tachycardia 04/14/2021   Chest pain 03/20/2021   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   Near syncope 03/20/2021   Body mass index (BMI) 30.0-30.9, adult 02/04/2021   Lumbar back pain with radiculopathy affecting right lower extremity 02/04/2021   Keloid 07/13/2014    Past Surgical History:  Procedure Laterality Date   APPENDECTOMY  2013   LUMBAR LAMINECTOMY/ DECOMPRESSION WITH MET-RX Right 05/22/2021   Procedure: MICODISCECTOMY, MINIMALLY INVASIVE, LUMBAR FIVE- SACRAL ONE;  Surgeon: Debby Dorn MATSU, MD;  Location: [facility] OR;  Service: Neurosurgery;  Laterality: Right;  MICODISCECTOMY, MINIMALLY INVASIVE, LUMBAR FIVE- SACRAL ONE   SHOULDER ARTHROSCOPY WITH LABRAL REPAIR Right 08/12/2018   Procedure: Right shoulder arthroscopic anterior labral repair and capsulorraphy   Right shoulder extensive glenohumeral debridement;  Surgeon: Tobie Priest, MD;  Location: [facility];  Service: Orthopedics;  Laterality: Right;  STAY 1ST CASE ARTHREX LABRAL TAPE 2.9MM PUSH LOCK AND 1.6 MM FIBERTAK STRAIGHT AND CURVED SUTURE PASSING  LASSOS TODD MUNDY ASSISTING       Home Medications    Prior to Admission medications  Medication Sig Start Date End Date Taking? Authorizing Provider  cyclobenzaprine  (FLEXERIL ) 10 MG tablet Take 1 tablet (10 mg total) by mouth 3 (three) times daily as needed for muscle spasms. 05/22/21   Debby Dorn MATSU, MD  ibuprofen  (ADVIL ) 800 MG tablet Take 1 tablet (800 mg total) by mouth every 8 (eight) hours as needed. 12/17/22   Orlean Alan HERO, FNP  ondansetron  (ZOFRAN  ODT) 4 MG disintegrating tablet Take 1 tablet (4 mg total) by mouth every 8 (eight) hours as needed for nausea or vomiting. Patient not taking: Reported on 05/15/2021 08/12/18   Tobie Priest, MD  pantoprazole (PROTONIX) 40 MG tablet Take 40 mg by mouth daily as needed for heartburn. 01/20/21   [provider]    Family History History reviewed. No pertinent family history.  Social History Social History[1]   Allergies   Erythromycin base and Zithromax [azithromycin]   Review of Systems Review of Systems  Constitutional:  Negative for chills and fever.  HENT:  Negative for congestion, ear pain and sore throat.   Respiratory:  Negative for cough and shortness of breath.   Gastrointestinal:  Positive for nausea. Negative for diarrhea and vomiting.  Neurological:  Positive for headaches.     Physical Exam Triage Vital Signs ED Triage Vitals  Encounter Vitals Group     BP      Girls Systolic BP Percentile      Girls Diastolic BP Percentile      Boys Systolic BP Percentile      Boys Diastolic BP Percentile      Pulse      Resp      Temp      Temp src      SpO2      Weight      Height      Head Circumference      Peak Flow      Pain Score       Pain Loc      Pain Education      Exclude from Growth Chart    No data found.  Updated Vital Signs BP 106/76 (BP Location: Right Arm)   Pulse 75   Temp 98.7 F (37.1 C) (Oral)   Resp 16   SpO2 96%   Visual Acuity Right Eye Distance:   Left Eye Distance:   Bilateral Distance:    Right Eye Near:   Left Eye Near:    Bilateral Near:     Physical Exam Constitutional:      General: He is not in acute distress. HENT:     Right Ear: Tympanic membrane normal.     Left Ear: Tympanic membrane normal.     Nose: Nose normal.     Mouth/Throat:     Mouth: Mucous membranes are moist.     Pharynx: Oropharynx is clear.  Cardiovascular:     Rate and Rhythm: Normal rate and regular rhythm.     Heart sounds: Normal heart sounds.  Pulmonary:     Effort: Pulmonary effort is normal. No respiratory distress.     Breath sounds: Normal breath sounds.  Neurological:     Mental Status: He is alert.      UC Treatments / Results  Labs (all labs ordered are listed, but only abnormal results are displayed) Labs Reviewed  POC COVID19/FLU A&B COMBO - Normal    EKG   Radiology No results found.  Procedures Procedures (including critical care time)  Medications Ordered in UC Medications - No data to display  Initial Impression / Assessment and Plan / UC Course  I have reviewed the triage vital signs and the nursing notes.  Pertinent labs & imaging results that were available during my care of the patient were reviewed by me and considered in my medical decision making (see chart for details).    Viral illness.  Afebrile and vital signs are stable.  Rapid COVID and flu negative.  Discussed symptomatic treatment including Tylenol  or ibuprofen  as needed for fever or discomfort, rest, hydration.  Instructed patient to follow-up with his PCP if not improving.  ED precautions given.  Patient agrees to plan of care.  Final Clinical Impressions(s) / UC Diagnoses   Final diagnoses:   Viral illness     Discharge Instructions      The COVID and flu tests are negative.   Take Tylenol  or ibuprofen  as needed for fever or discomfort.  Rest and keep yourself hydrated.    Follow-up with your primary care provider if your symptoms are not improving.         ED Prescriptions   None    PDMP not reviewed this encounter.    [1]  Social  History Tobacco Use   Smoking status: Never   Smokeless tobacco: Never  Vaping Use   Vaping status: Never Used  Substance Use Topics   Alcohol use: No     Corlis Burnard DEL, NP 08/18/24 1239  "

## 2024-08-18 NOTE — Discharge Instructions (Addendum)
 The COVID and flu tests are negative.   Take Tylenol or ibuprofen as needed for fever or discomfort.  Rest and keep yourself hydrated.    Follow-up with your primary care provider if your symptoms are not improving.

## 2024-08-18 NOTE — ED Triage Notes (Signed)
 Body aches, headache, nausea, possible fever pt sates he felt hot x 1 day. Taking nyquil.

## 2024-09-28 ENCOUNTER — Ambulatory Visit: Admitting: Nurse Practitioner
